# Patient Record
Sex: Female | Born: 1980 | Race: White | Hispanic: No | Marital: Single | Smoking: Current every day smoker
Health system: Southern US, Community
[De-identification: ages and names within clinical notes are randomized; demographics above are authoritative.]

## PROBLEM LIST (undated history)

## (undated) DIAGNOSIS — J189 Pneumonia, unspecified organism: Secondary | ICD-10-CM

## (undated) HISTORY — DX: Pneumonia, unspecified organism: J18.9

---

## 2004-11-16 ENCOUNTER — Other Ambulatory Visit: Admission: RE | Admit: 2004-11-16 | Discharge: 2004-11-16 | Payer: Self-pay | Admitting: Obstetrics and Gynecology

## 2004-11-17 ENCOUNTER — Ambulatory Visit (HOSPITAL_COMMUNITY): Admission: AD | Admit: 2004-11-17 | Discharge: 2004-11-17 | Payer: Self-pay | Admitting: Obstetrics and Gynecology

## 2005-07-01 ENCOUNTER — Inpatient Hospital Stay (HOSPITAL_COMMUNITY): Admission: AD | Admit: 2005-07-01 | Discharge: 2005-07-01 | Payer: Self-pay | Admitting: Obstetrics and Gynecology

## 2005-07-08 ENCOUNTER — Inpatient Hospital Stay (HOSPITAL_COMMUNITY): Admission: AD | Admit: 2005-07-08 | Discharge: 2005-07-11 | Payer: Self-pay | Admitting: Obstetrics and Gynecology

## 2006-04-02 ENCOUNTER — Ambulatory Visit: Payer: Self-pay | Admitting: Family Medicine

## 2006-04-26 ENCOUNTER — Ambulatory Visit: Payer: Self-pay | Admitting: Family Medicine

## 2006-05-01 ENCOUNTER — Other Ambulatory Visit: Admission: RE | Admit: 2006-05-01 | Discharge: 2006-05-01 | Payer: Self-pay | Admitting: Obstetrics and Gynecology

## 2006-10-29 ENCOUNTER — Telehealth: Payer: Self-pay | Admitting: *Deleted

## 2006-10-31 ENCOUNTER — Ambulatory Visit: Payer: Self-pay | Admitting: Sports Medicine

## 2006-10-31 DIAGNOSIS — K3189 Other diseases of stomach and duodenum: Secondary | ICD-10-CM

## 2006-10-31 DIAGNOSIS — R1013 Epigastric pain: Secondary | ICD-10-CM

## 2006-10-31 DIAGNOSIS — F331 Major depressive disorder, recurrent, moderate: Secondary | ICD-10-CM | POA: Insufficient documentation

## 2006-11-12 ENCOUNTER — Telehealth (INDEPENDENT_AMBULATORY_CARE_PROVIDER_SITE_OTHER): Payer: Self-pay | Admitting: *Deleted

## 2007-10-08 ENCOUNTER — Telehealth (INDEPENDENT_AMBULATORY_CARE_PROVIDER_SITE_OTHER): Payer: Self-pay | Admitting: Family Medicine

## 2007-10-24 ENCOUNTER — Ambulatory Visit: Payer: Self-pay | Admitting: Family Medicine

## 2007-10-24 DIAGNOSIS — L259 Unspecified contact dermatitis, unspecified cause: Secondary | ICD-10-CM

## 2007-12-22 ENCOUNTER — Ambulatory Visit: Payer: Self-pay | Admitting: Family Medicine

## 2007-12-22 DIAGNOSIS — R319 Hematuria, unspecified: Secondary | ICD-10-CM

## 2007-12-22 LAB — CONVERTED CEMR LAB
Bilirubin Urine: NEGATIVE
Protein, U semiquant: 100
Specific Gravity, Urine: >=1.03

## 2007-12-23 ENCOUNTER — Telehealth: Payer: Self-pay | Admitting: *Deleted

## 2009-04-09 ENCOUNTER — Encounter (INDEPENDENT_AMBULATORY_CARE_PROVIDER_SITE_OTHER): Payer: Self-pay | Admitting: *Deleted

## 2009-04-09 DIAGNOSIS — F17200 Nicotine dependence, unspecified, uncomplicated: Secondary | ICD-10-CM

## 2009-08-26 ENCOUNTER — Ambulatory Visit: Payer: Self-pay | Admitting: Family Medicine

## 2009-08-26 ENCOUNTER — Telehealth: Payer: Self-pay | Admitting: Family Medicine

## 2009-08-26 DIAGNOSIS — R05 Cough: Secondary | ICD-10-CM | POA: Insufficient documentation

## 2009-09-01 ENCOUNTER — Telehealth: Payer: Self-pay | Admitting: Family Medicine

## 2009-09-01 ENCOUNTER — Ambulatory Visit: Payer: Self-pay | Admitting: Family Medicine

## 2009-09-01 DIAGNOSIS — J189 Pneumonia, unspecified organism: Secondary | ICD-10-CM

## 2009-09-02 ENCOUNTER — Telehealth: Payer: Self-pay | Admitting: Family Medicine

## 2010-08-01 NOTE — Progress Notes (Signed)
Summary: pls call  Phone Note Call from Patient Call back at [phone]   Caller: Patient Summary of Call: pt needs to know whether she is contagious b/c her child wants to see her mother. Initial call taken by: De Nurse,  September 02, 2009 11:24 AM  Follow-up for Phone Call        called na  If she calls back tell her will not be very contagious after her fever is gone and she has two doses of antibiotics  Follow-up by: Pearlean Brownie MD,  September 02, 2009 1:49 PM

## 2010-08-01 NOTE — Progress Notes (Signed)
Summary: triage  Phone Note Call from Patient Call back at [phone]   Caller: Patient Summary of Call: started throwing up again yesterday and fever i s back - congested/coughing  Initial call taken by: De Nurse,  September 01, 2009 8:50 AM  Follow-up for Phone Call        states she is unable to keep anything down except for fruit. has tried chicken broth & oatmeal. fever continues. uses ibu & then it returns. has missed a lot of work & wants to be seen again. placed with Dr. Deirdre Priest as pcp not in schedule today. she is ok seeing another md Follow-up by: Golden Circle RN,  September 01, 2009 8:58 AM

## 2010-08-01 NOTE — Progress Notes (Signed)
   called patient - feeling better no fever this AM  Pearlean Brownie MD  September 02, 2009 10:53 AM

## 2010-08-01 NOTE — Progress Notes (Signed)
Summary: triage  Phone Note Call from Patient Call back at [phone]   Caller: Mom-Donna Summary of Call: sore throat/cough/fever(went away last night) - works in [employer] and can't get here until after 2:30 Initial call taken by: De Nurse,  August 26, 2009 9:42 AM  Follow-up for Phone Call        she will be here for 3pm work in Follow-up by: Golden Circle RN,  August 26, 2009 9:49 AM

## 2010-08-01 NOTE — Assessment & Plan Note (Signed)
Summary: cold symptoms/[place]/Jes Costales   Vital Signs:  Patient profile:   30 year old female Height:      66 inches Weight:      125 pounds BMI:     20.25 BSA:     1.64 Temp:     99.4 degrees F Pulse rate:   123 / minute BP sitting:   125 / 84  Vitals Entered By: Jone Baseman CMA (August 26, 2009 3:12 PM) CC: congestion x 3 days Is Patient Diabetic? No Pain Assessment Patient in pain? no        CC:  congestion x 3 days.  History of Present Illness: 1. congestion Has had fever and congestion for the last 3 days. Dtr sick with URI symptoms since saturday. Legs were sore initially but now better. Fever up to 102.5 -- mostly in the mornings. Used the humidier which seemed to help the cough some. Has taken dayquil with some relief.   ROS: some nausea and vomiting. No diarrhea. No joint or muscle aches now. No SOB. Some chest burnning with cough. Sore, raw throat with cough.   did not get the flu shot this year.   Habits & Providers  Alcohol-Tobacco-Diet     Tobacco Status: current     Tobacco Counseling: to quit use of tobacco products     Cigarette Packs/Day: 0.5  Current Medications (verified): 1)  Triamcinolone Acetonide 0.5 %  Crea (Triamcinolone Acetonide) .... Apply To Affected Area Daily During Flare.  Do Not Apply To Face or Genital.  15 Gram Tube  Allergies (verified): 1)  ! Keflex (Cephalexin)  Social History: Packs/Day:  0.5  Physical Exam  Additional Exam:  General:  Vital signs reviewed -- thin, tachy on vitals Alert, appropriate; well-dressed and well-nourished HEENT: EOMI, sclerae clear, OP pink and moist, no erythema or exudate; TMs with cerumen impaction bilaterally - clear after irrigation Neck: mild cervical LAD. No dominant node. Lungs:  work of breathing unlabored, clear to auscultation bilaterally; no wheezes, rales, or ronchi; good air movement throughout Heart:  regular rate and rhythm, no murmurs; normal s1/s2 (not tachy) Extremities:  no  cyanosis, clubbing, or edema Neurologic:  alert and oriented. speech normal.    Impression & Recommendations:  Problem # 1:  COUGH (ICD-786.2)  viral URI. cough medicine and other supportive measures.   Orders: [facility]- Est Level  3 (04540)  Complete Medication List: 1)  Triamcinolone Acetonide 0.5 % Crea (Triamcinolone acetonide) .... Apply to affected area daily during flare.  do not apply to face or genital.  15 gram tube 2)  Hydrocodone-homatropine 5-1.5 Mg/20ml Syrp (Hydrocodone-homatropine) .... 1/2 to 1 teaspoon every 12 hours as needed for cough 3)  Pseudoephedrine Hcl 60 Mg Tabs (Pseudoephedrine hcl) .... One by mouth every 6 hours as needed  Patient Instructions: 1)  This is a virus. It should run its course in about 7-10 days. 2)  Take ibuprofen 600 mg three to four times a day to help with pain, headache and fever. Take with food. 3)  Take pseudoephedrine to help with congestion and headache.  4)  take the cough medicine every 12 hours as needed.  5)  Some times gargling with saltwater can help your throat.  6)  Make sure you're drinking plenty of fluids. Your urine should run clear. 7)  Call if you feel worse suddenly or if not some better in the next 7 days.  Prescriptions: PSEUDOEPHEDRINE HCL 60 MG TABS (PSEUDOEPHEDRINE HCL) one by mouth every 6 hours as  needed  #30 x 0   Entered and Authorized by:   Myrtie Soman  MD   Signed by:   Myrtie Soman  MD on 08/26/2009   Method used:   Print then Give to Patient   RxID:   [id number] HYDROCODONE-HOMATROPINE 5-1.5 MG/5ML SYRP (HYDROCODONE-HOMATROPINE) 1/2 to 1 teaspoon every 12 hours as needed for cough  #60 cc x 0   Entered and Authorized by:   Myrtie Soman  MD   Signed by:   Myrtie Soman  MD on 08/26/2009   Method used:   Print then Give to Patient   RxID:   [id number] PSEUDOEPHEDRINE HCL 60 MG TABS (PSEUDOEPHEDRINE HCL) one by mouth every 6 hours as needed  #30 x 0   Entered and Authorized by:   Myrtie Soman  MD   Signed by:   Myrtie Soman  MD on 08/26/2009   Method used:   Print then Give to Patient   RxID:   [id number] HYDROCODONE-HOMATROPINE 5-1.5 MG/5ML SYRP (HYDROCODONE-HOMATROPINE) 1/2 to 1 teaspoon every 12 hours as needed for cough  #60 cc x 0   Entered and Authorized by:   Myrtie Soman  MD   Signed by:   Myrtie Soman  MD on 08/26/2009   Method used:   Print then Give to Patient   RxID:   [id number]

## 2010-08-01 NOTE — Assessment & Plan Note (Signed)
Summary: N&V, fever/see last OV/[place]   Vital Signs:  Patient profile:   30 year old female Height:      66 inches Weight:      120.9 pounds BMI:     19.58 Temp:     98.4 degrees F oral Pulse rate:   99 / minute BP sitting:   133 / 84  (right arm) Cuff size:   regular  Vitals Entered By: Garen Grams LPN (September 02, 4130 11:24 AM) CC: fever, cough, congestion, nausea, no apetite Is Patient Diabetic? No Pain Assessment Patient in pain? no        CC:  fever, cough, congestion, nausea, and no apetite.  History of Present Illness: URI Symptoms Onset: see Dr Rexene Alberts note on 2/8  since has worsened Description: achyal over productive cough  Modifying factors: tylenol and motrin help some Taking prescribed cough med and pseudophed  Symptoms Nasal discharge: yellow Fever: to 101 Sore throat: Mild Cough: constant Wheezing: no Ear pain: no GI symptoms: yes unable tokeep down solids drinking liquids well Sick contacts: dtr  Red Flags  Stiff neck: no Dyspnea: mild Rash: No Swallowing difficulty: No  Sinusitis Risk Factors Headache/face pain: yes Double sickening: yes tooth pain: no  Some pain in upper abdominal and right back.  No dysuria.  Was able to work yesterday   Habits & Providers  Alcohol-Tobacco-Diet     Tobacco Status: current     Cigarette Packs/Day: 0.5  Current Medications (verified): 1)  Triamcinolone Acetonide 0.5 %  Crea (Triamcinolone Acetonide) .... Apply To Affected Area Daily During Flare.  Do Not Apply To Face or Genital.  15 Gram Tube 2)  Hydrocodone-Homatropine 5-1.5 Mg/41ml Syrp (Hydrocodone-Homatropine) .... 1/2 To 1 Teaspoon Every 12 Hours As Needed For Cough 3)  Pseudoephedrine Hcl 60 Mg Tabs (Pseudoephedrine Hcl) .... One By Mouth Every 6 Hours As Needed 4)  Zithromax Z-Pak 250 Mg  Tabs (Azithromycin) .... Use As Directed  Allergies: 1)  ! Keflex (Cephalexin)  Physical Exam  General:  fatigued mildly ill no acute distress or  shortness of breath noted Eyes:  tender to percussion bilaterally  under eyes  Nose:  clear discharge  Mouth:  MM moist  Neck:  No deformities, masses, or tenderness noted.  supple Lungs:  crackles and dull in R base with rhonchi otherwise clear elsewhere Heart:  Normal rate and regular rhythm. S1 and S2 normal without gallop, murmur, click, rub or other extra sounds. Abdomen:  Bowel sounds positive,abdomen soft and non-tender without masses, organomegaly or hernias noted.   Impression & Recommendations:  Problem # 1:  PNEUMONIA, ORGANISM UNSPECIFIED (ICD-486)  post viral infectino with fever purulent sputum and focal lung findings.  Is stable so treat as otpt with antibiotics.  Encourage liquids and rest warned if worsening to return  Her updated medication list for this problem includes:    Zithromax Z-pak 250 Mg Tabs (Azithromycin) ..... Use as directed  Orders: [facility]- Est  Level 4 (44010)  Problem # 2:  TOBACCO USER (ICD-305.1) discussed she feels needs a substitute since is mainly a habit   Complete Medication List: 1)  Triamcinolone Acetonide 0.5 % Crea (Triamcinolone acetonide) .... Apply to affected area daily during flare.  do not apply to face or genital.  15 gram tube 2)  Hydrocodone-homatropine 5-1.5 Mg/66ml Syrp (Hydrocodone-homatropine) .... 1/2 to 1 teaspoon every 12 hours as needed for cough 3)  Pseudoephedrine Hcl 60 Mg Tabs (Pseudoephedrine hcl) .... One by mouth every 6 hours  as needed 4)  Zithromax Z-pak 250 Mg Tabs (Azithromycin) .... Use as directed  Patient Instructions: 1)  follow up with Dr Rexene Alberts as scheduled 2)  Doreatha Martin all your antibiotics  3)  If you develop high fever > 103 or difficulty breathing or this last for more than 7 days or if you are getting dehydrated 4)  Plenty of fluids 5)  Use tyelenol and motrin as needed Prescriptions: ZITHROMAX Z-PAK 250 MG  TABS (AZITHROMYCIN) Use as directed  #1 x 0   Entered and Authorized by:   Pearlean Brownie MD   Signed by:   Pearlean Brownie MD on 09/01/2009   Method used:   Handwritten   RxID:   [id number]

## 2010-08-01 NOTE — Letter (Signed)
Summary: Out of Work  [facility]  [address]   [place], [place] [address]   Phone: [phone]  Fax: [phone]    September 01, 2009   Employee:  [name]    To Whom It May Concern:   For Medical reasons, please excuse the above named employee from work for the following dates:  Start:   09/01/09  End:   09/03/09  If you need additional information, please feel free to contact our office.         Sincerely,    Pearlean Brownie MD

## 2010-08-05 ENCOUNTER — Encounter: Payer: Self-pay | Admitting: *Deleted

## 2010-11-17 NOTE — H&P (Signed)
NAME:  Bonnie Moody                 ACCOUNT NO.:  [id number]   MEDICAL RECORD NO.:  [id number]          PATIENT TYPE:  INP   LOCATION:                                FACILITY:  [facility]   PHYSICIAN:  Naima A. Dillard, M.D. DATE OF BIRTH:  04-25-1981   DATE OF ADMISSION:  07/08/2005  DATE OF DISCHARGE:  07/11/2005                                HISTORY & PHYSICAL   [name] is a 30 year old gravida 1, para 0, who presents at 39-2/7 weeks,  EDD July 13, 2005, with contractions increasing in frequency and  intensity throughout the day.  Her contractions are now every three minutes,  becoming stronger.  She reports positive fetal movement.  No vaginal  bleeding or rupture of membranes.  She denies any headache, visual changes  or epigastric pain.  Her pregnancy has been followed by the C.N.M. service  at [facility] and is remarkable for:   1.  Late to care.  2.  History of ovarian cyst.  3.  History of PID.  4.  Group B strep negative.   This patient began prenatal care at the office of [facility] at 17 weeks'  gestation.  EDC confirmed by ultrasound, confirmed with follow-up.  This  patient's pregnancy has been essentially unremarkable.  She has had some  preterm contractions.  Fetal fibronectin has been negative at 31 weeks and  at 33 weeks, and she has been normotensive throughout her pregnancy with no  proteinuria.   Prenatal lab work on Nov 16, 2004:  Hemoglobin and hematocrit 15.1 and 42.1,  platelets 344,000.  Blood type and Rh A+, antibody screen negative.  VDRL  nonreactive . Rubella immune.  hepatitis B surface antigen negative.  HIV  declined.  CF testing declined.  Pap smear within normal limits.  Quad  screen within normal limits.  At 28 weeks, one-hour glucose challenge within  normal limits.  At 36 weeks, culture of the vaginal tract is negative for  group B strep.   OBSTETRICAL HISTORY:  The current pregnancy.   The patient has allergies CEPHALEXIN.   She denies the use of  tobacco, alcohol or illicit drugs.   FAMILY HISTORY:  Maternal grandfather, hypertension.  Maternal grandmother,  varicose veins.  Maternal great-grandmother, colon cancer.   GENETIC HISTORY:  There is no genetic history of familial or chromosomal  disorders, children that were born with birth defects or any that died in  infancy.   SOCIAL HISTORY:  [name] a single 30 year old Caucasian female.  She is a  Consulting civil engineer.  Father of baby, Eartha Inch, works at the [employer].  He is involved and supportive.  They are [facility] in their faith.   REVIEW OF SYSTEMS:  As described above.  The patient is typical of one with  w uterine pregnancy at 39 weeks in early active labor.   PHYSICAL EXAMINATION:  VITAL SIGNS:  Stable. The patient is afebrile.  HEENT:  is unremarkable.  CARDIAC:  Regular rate and rhythm.  LUNGS:  Clear.  ABDOMEN:  Gravid in its contour.  The uterine fundus is  noted to extend 39  cm above the level of the pubic symphysis.  Leopold's maneuvers find the  infant to be in a longitudinal lie, cephalic presentation, and the estimated  fetal weight is 7-1/2 pounds.  The baseline of the fetal heart rate monitor  is 140s with average long-term variability.  Reactivity is present with no  periodic changes.  The patient is contracting every three to four minutes.  PELVIC:  Digital exam of the cervix finds it to be 4 cm dilated, 90%  effaced, with the cephalic presenting part at a -1 station and a bulging bag  of waters.  EXTREMITIES:  No pathologic edema.  DTRs are 1+ with no clonus.  No calf  tenderness is noted bilaterally.   ASSESSMENT:  Intrauterine pregnancy at term, early active labor.   PLAN:  Admit per Dr. Normand Sloop, routine C.N.M. orders.  The patient may have  epidural p.r.n.  Anticipate spontaneous vaginal delivery.      Rica Koyanagi, C.N.M.      Naima A. Normand Sloop, M.D.  Electronically Signed    SDM/MEDQ  D:  07/08/2005  T:  07/09/2005  Job:   [id number]

## 2010-11-29 ENCOUNTER — Inpatient Hospital Stay (INDEPENDENT_AMBULATORY_CARE_PROVIDER_SITE_OTHER)
Admission: RE | Admit: 2010-11-29 | Discharge: 2010-11-29 | Disposition: A | Discharge status: Home / Self Care | Payer: Managed Care, Other (non HMO) | Source: Ambulatory Visit | Attending: Family Medicine | Admitting: Family Medicine

## 2010-11-29 ENCOUNTER — Ambulatory Visit (INDEPENDENT_AMBULATORY_CARE_PROVIDER_SITE_OTHER): Payer: Managed Care, Other (non HMO)

## 2010-11-29 DIAGNOSIS — J189 Pneumonia, unspecified organism: Secondary | ICD-10-CM

## 2012-07-11 ENCOUNTER — Ambulatory Visit: Payer: Managed Care, Other (non HMO) | Admitting: Family Medicine

## 2013-02-20 ENCOUNTER — Ambulatory Visit: Payer: Self-pay | Admitting: Nurse Practitioner

## 2020-04-01 ENCOUNTER — Emergency Department (HOSPITAL_COMMUNITY)
Admission: EM | Admit: 2020-04-01 | Discharge: 2020-04-02 | Disposition: A | Discharge status: Home / Self Care | Payer: BC Managed Care – PPO | Attending: Emergency Medicine | Admitting: Emergency Medicine

## 2020-04-01 ENCOUNTER — Other Ambulatory Visit: Payer: Self-pay

## 2020-04-01 ENCOUNTER — Encounter (HOSPITAL_COMMUNITY): Payer: Self-pay | Admitting: Emergency Medicine

## 2020-04-01 DIAGNOSIS — J189 Pneumonia, unspecified organism: Secondary | ICD-10-CM

## 2020-04-01 DIAGNOSIS — F17210 Nicotine dependence, cigarettes, uncomplicated: Secondary | ICD-10-CM | POA: Insufficient documentation

## 2020-04-01 DIAGNOSIS — E876 Hypokalemia: Secondary | ICD-10-CM | POA: Insufficient documentation

## 2020-04-01 DIAGNOSIS — J188 Other pneumonia, unspecified organism: Secondary | ICD-10-CM | POA: Diagnosis not present

## 2020-04-01 DIAGNOSIS — J850 Gangrene and necrosis of lung: Secondary | ICD-10-CM | POA: Diagnosis not present

## 2020-04-01 DIAGNOSIS — Z20822 Contact with and (suspected) exposure to covid-19: Secondary | ICD-10-CM | POA: Insufficient documentation

## 2020-04-01 DIAGNOSIS — R059 Cough, unspecified: Secondary | ICD-10-CM | POA: Diagnosis present

## 2020-04-01 HISTORY — DX: Pneumonia, unspecified organism: J18.9

## 2020-04-01 LAB — CBC WITH DIFFERENTIAL/PLATELET
Abs Immature Granulocytes: 0.07 10*3/uL (ref 0.00–0.07)
Basophils Absolute: 0 10*3/uL (ref 0.0–0.1)
Basophils Relative: 0 %
Eosinophils Absolute: 0 10*3/uL (ref 0.0–0.5)
Eosinophils Relative: 0 %
HCT: 36.4 % (ref 36.0–46.0)
Hemoglobin: 12.6 g/dL (ref 12.0–15.0)
Immature Granulocytes: 1 %
Lymphocytes Relative: 6 %
Lymphs Abs: 0.9 10*3/uL (ref 0.7–4.0)
MCH: 33.1 pg (ref 26.0–34.0)
MCHC: 34.6 g/dL (ref 30.0–36.0)
MCV: 95.5 fL (ref 80.0–100.0)
Monocytes Absolute: 1 10*3/uL (ref 0.1–1.0)
Monocytes Relative: 7 %
Neutro Abs: 12.9 10*3/uL — ABNORMAL HIGH (ref 1.7–7.7)
Neutrophils Relative %: 86 %
Platelets: 616 10*3/uL — ABNORMAL HIGH (ref 150–400)
RBC: 3.81 MIL/uL — ABNORMAL LOW (ref 3.87–5.11)
RDW: 13.5 % (ref 11.5–15.5)
WBC: 14.9 10*3/uL — ABNORMAL HIGH (ref 4.0–10.5)
nRBC: 0 % (ref 0.0–0.2)

## 2020-04-01 LAB — I-STAT BETA HCG BLOOD, ED (MC, WL, AP ONLY): I-stat hCG, quantitative: 15.6 m[IU]/mL — ABNORMAL HIGH (ref <5)

## 2020-04-01 NOTE — ED Triage Notes (Addendum)
Pt advised by UC to come to ED for chest CT, pt c/o cough, night sweats x 3 weeks, emesis today, decreased appetite, sore throat.  Pt has copy of rad report of chest xray done today, ?cavitation, ?fluid.  Neg covid test, pt has weekly covid test as an employee of [employer]. Pt did start taking Zpak

## 2020-04-02 ENCOUNTER — Telehealth: Payer: Self-pay | Admitting: Pulmonary Disease

## 2020-04-02 ENCOUNTER — Emergency Department (HOSPITAL_COMMUNITY): Payer: BC Managed Care – PPO

## 2020-04-02 ENCOUNTER — Encounter (HOSPITAL_COMMUNITY): Payer: Self-pay | Admitting: Emergency Medicine

## 2020-04-02 DIAGNOSIS — J850 Gangrene and necrosis of lung: Secondary | ICD-10-CM

## 2020-04-02 LAB — URINALYSIS, ROUTINE W REFLEX MICROSCOPIC
Bilirubin Urine: NEGATIVE
Glucose, UA: NEGATIVE mg/dL
Ketones, ur: 5 mg/dL — AB
Nitrite: NEGATIVE
Protein, ur: 100 mg/dL — AB
Specific Gravity, Urine: 1.02 (ref 1.005–1.030)
pH: 5 (ref 5.0–8.0)

## 2020-04-02 LAB — COMPREHENSIVE METABOLIC PANEL
ALT: 12 U/L (ref 0–44)
AST: 11 U/L — ABNORMAL LOW (ref 15–41)
Albumin: 2.5 g/dL — ABNORMAL LOW (ref 3.5–5.0)
Alkaline Phosphatase: 87 U/L (ref 38–126)
Anion gap: 14 (ref 5–15)
BUN: <5 mg/dL — ABNORMAL LOW (ref 6–20)
CO2: 25 mmol/L (ref 22–32)
Calcium: 8.7 mg/dL — ABNORMAL LOW (ref 8.9–10.3)
Chloride: 96 mmol/L — ABNORMAL LOW (ref 98–111)
Creatinine, Ser: 0.65 mg/dL (ref 0.44–1.00)
GFR calc Af Amer: >60 mL/min (ref >60)
GFR calc non Af Amer: >60 mL/min (ref >60)
Glucose, Bld: 128 mg/dL — ABNORMAL HIGH (ref 70–99)
Potassium: 2.8 mmol/L — ABNORMAL LOW (ref 3.5–5.1)
Sodium: 135 mmol/L (ref 135–145)
Total Bilirubin: 0.9 mg/dL (ref 0.3–1.2)
Total Protein: 6.8 g/dL (ref 6.5–8.1)

## 2020-04-02 LAB — PROTIME-INR
INR: 1 (ref 0.8–1.2)
Prothrombin Time: 12.7 seconds (ref 11.4–15.2)

## 2020-04-02 LAB — RAPID URINE DRUG SCREEN, HOSP PERFORMED
Amphetamines: NOT DETECTED
Barbiturates: POSITIVE — AB
Benzodiazepines: NOT DETECTED
Cocaine: NOT DETECTED
Opiates: NOT DETECTED
Tetrahydrocannabinol: POSITIVE — AB

## 2020-04-02 LAB — RESPIRATORY PANEL BY RT PCR (FLU A&B, COVID)
Influenza A by PCR: NEGATIVE
Influenza B by PCR: NEGATIVE
SARS Coronavirus 2 by RT PCR: NEGATIVE

## 2020-04-02 LAB — HIV ANTIBODY (ROUTINE TESTING W REFLEX): HIV Screen 4th Generation wRfx: NONREACTIVE

## 2020-04-02 LAB — EXPECTORATED SPUTUM ASSESSMENT W GRAM STAIN, RFLX TO RESP C

## 2020-04-02 LAB — STREP PNEUMONIAE URINARY ANTIGEN: Strep Pneumo Urinary Antigen: NEGATIVE

## 2020-04-02 LAB — LACTIC ACID, PLASMA: Lactic Acid, Venous: 1.4 mmol/L (ref 0.5–1.9)

## 2020-04-02 LAB — PREGNANCY, URINE: Preg Test, Ur: NEGATIVE

## 2020-04-02 MED ORDER — IOHEXOL 300 MG/ML  SOLN
75.0000 mL | Freq: Once | INTRAMUSCULAR | Status: AC | PRN
  Administered 2020-04-02: 75 mL via INTRAVENOUS

## 2020-04-02 MED ORDER — ONDANSETRON HCL 4 MG PO TABS: 4.0000 mg | ORAL_TABLET | Freq: Four times a day (QID) | ORAL | 0 refills | Status: DC

## 2020-04-02 MED ORDER — SODIUM CHLORIDE 0.9 % IV BOLUS
1000.0000 mL | Freq: Once | INTRAVENOUS | Status: AC
  Administered 2020-04-02: 1000 mL via INTRAVENOUS

## 2020-04-02 MED ORDER — ACETAMINOPHEN 325 MG PO TABS
650.0000 mg | ORAL_TABLET | Freq: Once | ORAL | Status: AC
  Administered 2020-04-02: 650 mg via ORAL
  Filled 2020-04-02: qty 2

## 2020-04-02 MED ORDER — AMOXICILLIN-POT CLAVULANATE 875-125 MG PO TABS: 1.0000 | ORAL_TABLET | Freq: Two times a day (BID) | ORAL | 0 refills | Status: DC

## 2020-04-02 MED ORDER — POTASSIUM CHLORIDE CRYS ER 20 MEQ PO TBCR
40.0000 meq | EXTENDED_RELEASE_TABLET | Freq: Once | ORAL | Status: AC
  Administered 2020-04-02: 40 meq via ORAL
  Filled 2020-04-02: qty 2

## 2020-04-02 MED ORDER — POTASSIUM CHLORIDE ER 10 MEQ PO TBCR: 10.0000 meq | EXTENDED_RELEASE_TABLET | Freq: Every day | ORAL | 0 refills | Status: DC

## 2020-04-02 MED ORDER — FLUCONAZOLE 150 MG PO TABS: ORAL_TABLET | ORAL | 0 refills | Status: DC

## 2020-04-02 MED ORDER — ONDANSETRON HCL 4 MG/2ML IJ SOLN
4.0000 mg | Freq: Once | INTRAMUSCULAR | Status: AC
  Administered 2020-04-02: 4 mg via INTRAVENOUS
  Filled 2020-04-02: qty 2

## 2020-04-02 MED ORDER — AMOXICILLIN-POT CLAVULANATE 875-125 MG PO TABS: 1.0000 | ORAL_TABLET | Freq: Two times a day (BID) | ORAL | 0 refills | Status: AC

## 2020-04-02 MED ORDER — LEVETIRACETAM ER 500 MG PO TB24
1000.0000 mg | ORAL_TABLET | Freq: Every day | ORAL | Status: DC
  Administered 2020-04-02: 1000 mg via ORAL
  Filled 2020-04-02: qty 2

## 2020-04-02 NOTE — Discharge Instructions (Addendum)
Start taking augmentin 875mg  twice daily for 1 month. You will follow at [facility] clinic on [address] in 4 weeks. They will call you with the appointment date and time.  I have also given you a prescription for potassium given the your potassium was low today.  Please take as directed.  You should have your labs rechecked by your regular doctor in 1 week for reassessment of this.  I gave a prescription for Zofran to help with potential nausea from taking the antibiotics and I also gave you a prescription for fluconazole in case you develop any symptoms of a yeast infection.  Do not take this medication unless you actually develop symptoms of a yeast infection.

## 2020-04-02 NOTE — Consult Note (Signed)
NAME:  Bonnie Moody, MRN:  [id number], DOB:  1981-05-07, LOS: 0 ADMISSION DATE:  04/01/2020, CONSULTATION DATE:  04/02/20 REFERRING MD:  Leonia Corona, PA CHIEF COMPLAINT:  Cough  Brief History   Bonnie Moody is a 39 year old woman, daily smoker who presented with a cough of 3 weeks with chills, fever and night sweats.    History of present illness   39 year old woman, daily smoke with epilepsy and history of pneumonias presents with 3 weeks of productive cough associated with fevers, chills, nigh sweats, nausea and anorexia.   She went to urgent care for her symptoms yesterday and was provided a z-pack. She was later called by the urgent care team regarding her chest radiograph results and was instructed to go to the emergency room for concern of right upper lobe cavitary lesion. In the ED she had CT chest scan performed which shows a 7.5 x 6.6 x 6.1cm consolidation with central cavitation and air fluid level.   She reports having 3 pneumonias over the past 15 years. She has poor dentition and reports being instructed to have her teeth removed but due to the expense of having this done she has delayed this care. 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 Most recent negative PPD was last year. She denies any extensive travel to TB endemic areas. She has history of epilepsy which is controlled on antiepileptic medications but she does report only having breakthrough seizures in her sleep. She does drink alcohol but denies binge drinking. She smokes 1 pack of cigarettes daily for the past 15 years and uses marijuana occasionally. Denies other drug use.   Past Medical History  Pneumonia Seizures Depression  Significant Hospital Events     Consults:  PCCM  Procedures:    Significant Diagnostic Tests:  CT Chest 04/02/20 IMPRESSION: 1. Large aggressive cavitary process predominantly within the right upper lobe and extending across the major fissure.  This measures at least 7.5 cm. There are additional tree-in-bud opacities in the right middle and right lower lobe. Findings most likely represent multifocal infection including tuberculosis, actinomycosis, mucormycosis and blastomycosis. Underlying neoplasm would be difficult to exclude. Pulmonary consult recommended. A follow-up CT is recommended post treatment. 2. Mildly enlarged mediastinal and right hilar lymph nodes, likely reactive. 3. Mild paraseptal emphysema.  Micro Data:  Blood culture 10/1>>  Antimicrobials:    Interim history/subjective:    Objective   Blood pressure 129/76, pulse 92, temperature 98.3 F (36.8 C), temperature source Oral, resp. rate 19, SpO2 97 %.       No intake or output data in the 24 hours ending 04/02/20 1449 There were no vitals filed for this visit.  Examination: General: No acute distress HENT: atraumatic, normocephalic, moist mucous membranes, poor dentition  Lungs: left clear to auscultation. Course breath sounds and rales on right lung fields Cardiovascular: RRR, s1s2, no murmur Abdomen: soft, non-tender, non-distended, bowels sounds present Extremities: no edema. Warm/dry Neuro: A&Ox3, no focal deficits  Resolved Hospital Problem list     Assessment & Plan:  Necrotizing Pneumonia In setting of poor dentition and aspiration risk with seizure disorder - Start augmentin 875mg  twice daily for 4 weeks (allergy to cephalexin reported but she has tolerated Augmentin previously) - We will schedule follow up in the pulmonary clinic with repeat chest radiograph in 4 weeks. At that time we will determine if further antibiotics are warranted.  - If she does not improve on  antibiotic therapy then we will consider bronchoscopy - She will require follow up CT chest in 3 months - Check HIV and quantiferon gold - Check urine drug screen - Obtain sputum culture   Tobacco Use - Briefly discussed the importance of smoking cessation - Will  follow up in clinic regarding smoking cessation therapies  Dental Disease - Recommended the patient have her dental disease assessed in order to prevent further pneumonias in the future as well as further complications within her oral cavity   Labs   CBC: Recent Labs  Lab 04/01/20 2339  WBC 14.9*  NEUTROABS 12.9*  HGB 12.6  HCT 36.4  MCV 95.5  PLT 616*    Basic Metabolic Panel: Recent Labs  Lab 04/01/20 2339  NA 135  K 2.8*  CL 96*  CO2 25  GLUCOSE 128*  BUN <5*  CREATININE 0.65  CALCIUM 8.7*   GFR: CrCl cannot be calculated (Unknown ideal weight.). Recent Labs  Lab 04/01/20 2339  WBC 14.9*  LATICACIDVEN 1.4    Liver Function Tests: Recent Labs  Lab 04/01/20 2339  AST 11*  ALT 12  ALKPHOS 87  BILITOT 0.9  PROT 6.8  ALBUMIN 2.5*   No results for input(s): LIPASE, AMYLASE in the last 168 hours. No results for input(s): AMMONIA in the last 168 hours.  ABG No results found for: PHART, PCO2ART, PO2ART, HCO3, TCO2, ACIDBASEDEF, O2SAT   Coagulation Profile: Recent Labs  Lab 04/01/20 2339  INR 1.0    Cardiac Enzymes: No results for input(s): CKTOTAL, CKMB, CKMBINDEX, TROPONINI in the last 168 hours.  HbA1C: No results found for: HGBA1C  CBG: No results for input(s): GLUCAP in the last 168 hours.  Review of Systems:   Review of Systems  Constitutional: Positive for chills and fever.  HENT: Negative for congestion, nosebleeds, sinus pain and sore throat.   Eyes: Negative for blurred vision.  Respiratory: Positive for cough, sputum production and shortness of breath. Negative for hemoptysis and wheezing.   Cardiovascular: Negative for chest pain, palpitations, orthopnea, claudication, leg swelling and PND.  Gastrointestinal: Positive for nausea and vomiting. Negative for blood in stool and diarrhea.  Genitourinary: Negative for dysuria and hematuria.  Musculoskeletal: Negative for joint pain and myalgias.  Skin: Negative for itching and rash.    Neurological: Negative for dizziness, weakness and headaches.  Endo/Heme/Allergies: Does not bruise/bleed easily.  Psychiatric/Behavioral: Negative.    Past Medical History  She,  has a past medical history of Pneumonia.   Surgical History   History reviewed. No pertinent surgical history.   Social History   reports that she has been smoking cigarettes. She has never used smokeless tobacco. She reports previous alcohol use. She reports previous drug use.   Family History   Her family history is not on file.   Allergies Allergies  Allergen Reactions  . Cephalexin     REACTION: unknown reaction as a baby     Home Medications  Prior to Admission medications   Medication Sig Start Date End Date Taking? Authorizing Provider  azithromycin (ZITHROMAX) 250 MG tablet Take 250 mg by mouth as directed. 04/01/20   [provider]  HYDROcodone-homatropine (HYCODAN) 5-1.5 MG/5ML syrup Take 1/2 to 1 teaspoon every 12 hours as needed for cough     [provider]  pseudoephedrine (SUDAFED) 60 MG tablet Take 60 mg by mouth every 6 (six) hours as needed.      [provider]  triamcinolone (KENALOG) 0.5 % cream Apply topically daily. to  affected area during flare.  Do not apply to face or genital     [provider]

## 2020-04-02 NOTE — Telephone Encounter (Signed)
Please schedule patient for hospital follow up on 11/1 or 11/2 during my clinic days and let her know the date/time of the appointment. She will need 2 view chest radiograph prior to the visit.   Thanks, Cletis Athens

## 2020-04-02 NOTE — ED Provider Notes (Signed)
[facility] EMERGENCY DEPARTMENT Provider Note   CSN: [id number] Arrival date & time: 04/01/20  2227     History Chief Complaint  Patient presents with  . Cough    Bonnie Moody is a 39 y.o. female.  HPI   Pt is a 39 y/o female who presents to the ED today for eval of a cough with an abnormal chest x-ray. Cough has been ongoing for the last few weeks. She has had a sputum production with yellow/green/brown sputum. Denies any hemoptysis. Denies BLE swelling, birth control use, recent surgery/hospital admission, h/o VTE/CA/IVDU.  She reports nausea, vomiting x1 week ago. Denies abd pain, diarrhea, constipation or urinary symptoms.  Denies chest pain or shortness of breath.   States she was seen at urgent care yesterday and was diagnosed with pneumonia and was given rx for zpak. Was later called back and sent to the ED after official radiology read showed  "suspect aggressive pneumonia RUL with cavitary air fluid level. Possible pulmonic abscess. Correlation with CT chest with IV contrast is recommended." (per paperwork mom has on her phone at bedside from the clinic)  She was tested at urgent care for COVID and it was negative. States she has never been in prison. 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 No recent foreign travel.   Past Medical History:  Diagnosis Date  . Pneumonia     Patient Active Problem List   Diagnosis Date Noted  . PNEUMONIA, ORGANISM UNSPECIFIED 09/01/2009  . COUGH 08/26/2009  . TOBACCO USER 04/09/2009  . HEMATURIA UNSPECIFIED 12/22/2007  . ECZEMA, HANDS 10/24/2007  . 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 DYSPEPSIA 10/31/2006    History reviewed. No pertinent surgical history.   OB History   No obstetric history on file.     No family history on file.  Social History   Tobacco Use  . Smoking status: Current Every Day Smoker    Types: Cigarettes  . Smokeless tobacco:  Never Used  Substance Use Topics  . Alcohol use: Not Currently  . Drug use: Not Currently    Home Medications Prior to Admission medications   Medication Sig Start Date End Date Taking? Authorizing Provider  amoxicillin-clavulanate (AUGMENTIN) 875-125 MG tablet Take 1 tablet by mouth every 12 (twelve) hours. 04/02/20 05/02/20  Macauley Mossberg S, PA-C  azithromycin (ZITHROMAX) 250 MG tablet Take 250 mg by mouth as directed. 04/01/20   [provider]  fluconazole (DIFLUCAN) 150 MG tablet Take one tablet on the day you fill the prescription. If you continue to have symptoms then take the second tablet in 3 days. 04/02/20   Kyrillos Adams S, PA-C  HYDROcodone-homatropine (HYCODAN) 5-1.5 MG/5ML syrup Take 1/2 to 1 teaspoon every 12 hours as needed for cough     [provider]  ondansetron (ZOFRAN) 4 MG tablet Take 1 tablet (4 mg total) by mouth every 6 (six) hours. 04/02/20   Albena Comes S, PA-C  potassium chloride (KLOR-CON) 10 MEQ tablet Take 1 tablet (10 mEq total) by mouth daily for 5 days. 04/02/20 04/07/20  Haniya Fern S, PA-C  pseudoephedrine (SUDAFED) 60 MG tablet Take 60 mg by mouth every 6 (six) hours as needed.      [provider]  triamcinolone (KENALOG) 0.5 % cream Apply topically daily. to affected area during flare.  Do not apply to face or genital     [provider]    Allergies  Cephalexin  Review of Systems   Review of Systems  Constitutional: Positive for chills, fatigue and fever.  HENT: Negative for ear pain and sore throat.   Eyes: Negative for visual disturbance.  Respiratory: Positive for cough. Negative for shortness of breath.   Cardiovascular: Negative for chest pain and leg swelling.  Gastrointestinal: Positive for nausea and vomiting. Negative for abdominal pain, constipation and diarrhea.  Genitourinary: Negative for dysuria and hematuria.  Musculoskeletal: Negative for back pain.  Skin: Negative for rash.    Neurological: Negative for syncope.  All other systems reviewed and are negative.   Physical Exam Updated Vital Signs BP 131/85   Pulse 84   Temp 98.3 F (36.8 C) (Oral)   Resp 18   SpO2 98%   Physical Exam Vitals and nursing note reviewed.  Constitutional:      General: She is not in acute distress.    Appearance: She is well-developed.  HENT:     Head: Normocephalic and atraumatic.     Mouth/Throat:     Mouth: Mucous membranes are dry.  Eyes:     Conjunctiva/sclera: Conjunctivae normal.  Cardiovascular:     Rate and Rhythm: Regular rhythm. Tachycardia present.     Pulses: Normal pulses.     Heart sounds: Normal heart sounds. No murmur heard.   Pulmonary:     Effort: Pulmonary effort is normal. No respiratory distress.     Breath sounds: Normal breath sounds. No wheezing, rhonchi or rales.  Abdominal:     General: Bowel sounds are normal.     Palpations: Abdomen is soft.     Tenderness: There is no abdominal tenderness. There is no guarding or rebound.  Musculoskeletal:     Cervical back: Neck supple.  Skin:    General: Skin is warm and dry.  Neurological:     Mental Status: She is alert.     ED Results / Procedures / Treatments   Labs (all labs ordered are listed, but only abnormal results are displayed) Labs Reviewed  COMPREHENSIVE METABOLIC PANEL - Abnormal; Notable for the following components:      Result Value   Potassium 2.8 (*)    Chloride 96 (*)    Glucose, Bld 128 (*)    BUN <5 (*)    Calcium 8.7 (*)    Albumin 2.5 (*)    AST 11 (*)    All other components within normal limits  CBC WITH DIFFERENTIAL/PLATELET - Abnormal; Notable for the following components:   WBC 14.9 (*)    RBC 3.81 (*)    Platelets 616 (*)    Neutro Abs 12.9 (*)    All other components within normal limits  URINALYSIS, ROUTINE W REFLEX MICROSCOPIC - Abnormal; Notable for the following components:   Color, Urine Sophiarose (*)    APPearance CLOUDY (*)    Hgb urine dipstick  SMALL (*)    Ketones, ur 5 (*)    Protein, ur 100 (*)    Leukocytes,Ua MODERATE (*)    Bacteria, UA RARE (*)    All other components within normal limits  I-STAT BETA HCG BLOOD, ED ([facility], [facility], [facility] ONLY) - Abnormal; Notable for the following components:   I-stat hCG, quantitative 15.6 (*)    All other components within normal limits  RESPIRATORY PANEL BY RT PCR (FLU A&B, COVID)  CULTURE, BLOOD (ROUTINE X 2)  CULTURE, BLOOD (ROUTINE X 2)  EXPECTORATED SPUTUM ASSESSMENT W REFEX TO RESP CULTURE  LACTIC ACID, PLASMA  PROTIME-INR  PREGNANCY, URINE  STREP PNEUMONIAE URINARY ANTIGEN  LEGIONELLA PNEUMOPHILA SEROGP 1 UR AG  QUANTIFERON-TB GOLD PLUS  HIV ANTIBODY (ROUTINE TESTING W REFLEX)  RAPID URINE DRUG SCREEN, HOSP PERFORMED    EKG None  Radiology CT Chest W Contrast  Result Date: 04/02/2020 CLINICAL DATA:  CT chest with contrast due to pneumonia Pt advised by UC to come to ED for chest CT, pt c/o cough, night sweats x 3 weeks, emesis today, decreased appetite, sore throat. Pt has copy of rad report of chest xray done today, ?cavi.*comment was truncated*^28mL OMNIPAQUE IOHEXOL 300 MG/ML SOLN EXAM: CT CHEST WITH CONTRAST TECHNIQUE: Multidetector CT imaging of the chest was performed during intravenous contrast administration. CONTRAST:  40mL OMNIPAQUE IOHEXOL 300 MG/ML  SOLN COMPARISON:  None. FINDINGS: Cardiovascular: No significant vascular findings. Normal heart size. No pericardial effusion. Mediastinum/Nodes: There are mildly enlarged mediastinal lymph nodes for example a precarinal lymph node measuring 1.2 cm in short axis (series 3, image 60). There are prominent right hilar lymph nodes. No axillary adenopathy. Thyroid gland, trachea, and esophagus demonstrate no significant findings. Lungs/Pleura: Central airways are patent. There is a large area of consolidation with central cavitation involving the posterior right upper lobe with extension across the major fissure into the right lower  lobe. Overall measurements are somewhat difficult given irregular borders but measures approximately 7.5 x 6.6 x 6.1 cm. There are additional tree-in-bud opacities in the involving the right middle and right lower lobe, predominantly medial. The left lung appears clear. No pneumothorax or pleural effusion. There is mild paraseptal emphysema. Upper Abdomen: No acute abnormality. Musculoskeletal: No chest wall abnormality. No acute or significant osseous findings. IMPRESSION: 1. Large aggressive cavitary process predominantly within the right upper lobe and extending across the major fissure. This measures at least 7.5 cm. There are additional tree-in-bud opacities in the right middle and right lower lobe. Findings most likely represent multifocal infection including tuberculosis, actinomycosis, mucormycosis and blastomycosis. Underlying neoplasm would be difficult to exclude. Pulmonary consult recommended. A follow-up CT is recommended post treatment. 2. Mildly enlarged mediastinal and right hilar lymph nodes, likely reactive. 3. Mild paraseptal emphysema. Emphysema (ICD10-J43.9). These results were called by telephone at the time of interpretation on 04/02/2020 at 11:56 am to provider [facility] , who verbally acknowledged these results. Electronically Signed   By: Emmaline Kluver M.D.   On: 04/02/2020 11:57    Procedures Procedures (including critical care time)  Medications Ordered in ED Medications  levETIRAcetam (KEPPRA XR) 24 hr tablet 1,000 mg (1,000 mg Oral Given 04/02/20 1214)  sodium chloride 0.9 % bolus 1,000 mL (0 mLs Intravenous Stopped 04/02/20 1215)  potassium chloride SA (KLOR-CON) CR tablet 40 mEq (40 mEq Oral Given 04/02/20 1043)  ondansetron (ZOFRAN) injection 4 mg (4 mg Intravenous Given 04/02/20 1043)  acetaminophen (TYLENOL) tablet 650 mg (650 mg Oral Given 04/02/20 1216)  iohexol (OMNIPAQUE) 300 MG/ML solution 75 mL (75 mLs Intravenous Contrast Given 04/02/20 1110)    ED Course    I have reviewed the triage vital signs and the nursing notes.  Pertinent labs & imaging results that were available during my care of the patient were reviewed by me and considered in my medical decision making (see chart for details).    MDM Rules/Calculators/A&P                          39 year old female presenting for evaluation of cough, fevers, nausea vomiting.  Reviewed/interpreted labs CBC shows a  leukocytosis at 14,000, thrombocytosis as well CMP with hypokalemia, potassium of 2.8, albumin low, LFTs and kidney function are normal Lactic acid is negative UA with moderate leuks, 0-5 RBCs, 11-20 WBCs, rare bacteria and 25-50 squamous epithelial cells suggesting contaminated specimen. Initial beta-hCG slightly positive, likely false positive Urine preg negative COVID/flu negative Blood cultures obtained  CT chest reviewed/interpreted - 1. Large aggressive cavitary process predominantly within the right upper lobe and extending across the major fissure. This measures at least 7.5 cm. There are additional tree-in-bud opacities in the right middle and right lower lobe. Findings most likely represent multifocal infection including tuberculosis, actinomycosis, mucormycosis and blastomycosis. Underlying neoplasm would be difficult to exclude. Pulmonary consult recommended. A follow-up CT is recommended post treatment. 2. Mildly enlarged mediastinal and right hilar lymph nodes, likely reactive. 3. Mild paraseptal emphysema.  12:21 PM CONSULT with APP with pulmonology who discussed case with her attending physician. Their team will come evaluate the patient.   Dr. Francine Graven with pulmonology evaluated the patient at bedside.  Suspects that this infection is likely secondary to aspiration and recommended giving Augmentin x1 month.  They have ordered several additional labs and have recommended that the patient follow-up in the office in 1 month for reassessment.  They do not feel that she requires  admission at this time.  Reassessed patient.  Discussed plan for discharge on Augmentin.  Advised on close follow-up with pulmonology and strict return precautions.  She voices understanding of the plan and reasons to return.  All questions answered.  Patient stable for discharge.   Final Clinical Impression(s) / ED Diagnoses Final diagnoses:  Cavitary pneumonia  Hypokalemia    Rx / DC Orders ED Discharge Orders         Ordered    amoxicillin-clavulanate (AUGMENTIN) 875-125 MG tablet  Every 12 hours,   Status:  Discontinued        04/02/20 1509    potassium chloride (KLOR-CON) 10 MEQ tablet  Daily,   Status:  Discontinued        04/02/20 1509    ondansetron (ZOFRAN) 4 MG tablet  Every 6 hours,   Status:  Discontinued        04/02/20 1509    amoxicillin-clavulanate (AUGMENTIN) 875-125 MG tablet  Every 12 hours        04/02/20 1612    ondansetron (ZOFRAN) 4 MG tablet  Every 6 hours        04/02/20 1612    potassium chloride (KLOR-CON) 10 MEQ tablet  Daily        04/02/20 1612    fluconazole (DIFLUCAN) 150 MG tablet        04/02/20 1614           Keslie Gritz S, PA-C 04/02/20 1616    Virgina Norfolk, DO 04/02/20 2108

## 2020-04-03 LAB — LEGIONELLA PNEUMOPHILA SEROGP 1 UR AG: L. pneumophila Serogp 1 Ur Ag: NEGATIVE

## 2020-04-04 LAB — CULTURE, RESPIRATORY W GRAM STAIN: Culture: NORMAL

## 2020-04-04 LAB — QUANTIFERON-TB GOLD PLUS (RQFGPL)
QuantiFERON Mitogen Value: 0.3 IU/mL
QuantiFERON Nil Value: 0.03 IU/mL
QuantiFERON TB1 Ag Value: 0.02 IU/mL
QuantiFERON TB2 Ag Value: 0.02 IU/mL

## 2020-04-04 LAB — QUANTIFERON-TB GOLD PLUS: QuantiFERON-TB Gold Plus: UNDETERMINED — AB

## 2020-04-04 NOTE — Telephone Encounter (Signed)
Attempted to call pt but line went straight to VM. Left message for pt to return call.  Pt needs a HFU appt scheduled with Dr. Francine Graven either 11/1 or 11/2. When pt returns call, please get this appt scheduled for her.

## 2020-04-05 ENCOUNTER — Telehealth: Payer: Self-pay | Admitting: *Deleted

## 2020-04-05 NOTE — Telephone Encounter (Signed)
Post ED Visit - Positive Culture Follow-up  Culture report reviewed by antimicrobial stewardship pharmacist: [facility] []  , Pharm.D. []  Enzo Bi, Pharm.D., BCPS AQ-ID []  , Pharm.D., BCPS []  Celedonio Miyamoto, Pharm.D., BCPS []  [place], Garvin Fila.D., BCPS, AAHIVP []  , Pharm.D., BCPS, AAHIVP []  Georgina Pillion, PharmD, BCPS []  , PharmD, BCPS []  [place], PharmD, BCPS []  [address], PharmD []  , PharmD, BCPS []  Estella Husk, PharmD  Pharmacy Team []  Lysle Pearl, PharmD []  , PharmD []  Phillips Climes, PharmD []  , Rph []  Agapito Games) , PharmD []  Verlan Friends, PharmD []  , PharmD []  Mervyn Gay, PharmD []  , PharmD []  Vinnie Level, PharmD []  [facility], PharmD []  , PharmD []  Len Childs, PharmD   Positive respiratory culture Treated with Amoxicillin-Pot Clavulanate, organism sensitive to the same and no further patient follow-up is required at this time. , MD  Greer Pickerel Talley 04/05/2020, 9:47 AM

## 2020-04-07 LAB — CULTURE, BLOOD (ROUTINE X 2)
Culture: NO GROWTH
Culture: NO GROWTH
Special Requests: ADEQUATE
Special Requests: ADEQUATE

## 2020-05-02 ENCOUNTER — Encounter: Payer: Self-pay | Admitting: Emergency Medicine

## 2020-05-02 ENCOUNTER — Encounter: Payer: Self-pay | Admitting: *Deleted

## 2020-05-02 ENCOUNTER — Ambulatory Visit: Payer: BC Managed Care – PPO | Admitting: Emergency Medicine

## 2020-05-02 ENCOUNTER — Other Ambulatory Visit: Payer: Self-pay

## 2020-05-02 DIAGNOSIS — J852 Abscess of lung without pneumonia: Secondary | ICD-10-CM | POA: Insufficient documentation

## 2020-05-02 DIAGNOSIS — J851 Abscess of lung with pneumonia: Secondary | ICD-10-CM | POA: Diagnosis not present

## 2020-05-02 NOTE — Progress Notes (Signed)
Subjective:    Patient ID: Bonnie Moody, female    DOB: 24-Oct-1980, 39 y.o.   MRN: [id number]  HPI 39 year old smoker (15 pack years), with a history of seizures, poor dentition.  She was in the emergency department 04/02/2020 with several days of fever, chills, night sweats, nausea and emesis.  Subsequently developed cough productive of sputum.  She underwent a CT scan of her chest on 10/2 during that evaluation which I have reviewed, shows a right upper lobe cavitary infiltrate that extends across the major fissure, 7.5 cm in largest diameter.  There were tree-in-bud inflammatory changes also seen in the right middle and right lower lobes, some mildly enlarged right hilar mediastinal nodes.  Respiratory culture from 10/2 shows normal flora.   She reports that her cough has resolved. Her nausea and PO intake have improved. She has completed a month of Augmentin. She is on Keppra, seizures well-controlled currently although she did have a seizure in her sleep just prior to her PNA. She is planning to see Neuro in [place] today. She is due to follow with dentistry.    Review of Systems As per HPI  Past Medical History:  Diagnosis Date  . Pneumonia       No family history on file.   Social History   Socioeconomic History  . Marital status: Single    Spouse name: Not on file  . Number of children: Not on file  . Years of education: Not on file  . Highest education level: Not on file  Occupational History  . Not on file  Tobacco Use  . Smoking status: Current Every Day Smoker    Packs/day: 0.50    Years: 15.00    Pack years: 7.50    Types: Cigarettes  . Smokeless tobacco: Never Used  Vaping Use  . Vaping Use: Never used  Substance and Sexual Activity  . Alcohol use: Not Currently  . Drug use: Not Currently  . Sexual activity: Not on file  Other Topics Concern  . Not on file  Social History Narrative  . Not on file   Social Determinants of Health   Financial Resource  Strain:   . Difficulty of Paying Living Expenses: Not on file  Food Insecurity:   . Worried About Programme researcher, broadcasting/film/video in the Last Year: Not on file  . Ran Out of Food in the Last Year: Not on file  Transportation Needs:   . Lack of Transportation (Medical): Not on file  . Lack of Transportation (Non-Medical): Not on file  Physical Activity:   . Days of Exercise per Week: Not on file  . Minutes of Exercise per Session: Not on file  Stress:   . Feeling of Stress : Not on file  Social Connections:   . Frequency of Communication with Friends and Family: Not on file  . Frequency of Social Gatherings with Friends and Family: Not on file  . Attends Religious Services: Not on file  . Active Member of Clubs or Organizations: Not on file  . Attends Banker Meetings: Not on file  . Marital Status: Not on file  Intimate Partner Violence:   . Fear of Current or Ex-Partner: Not on file  . Emotionally Abused: Not on file  . Physically Abused: Not on file  . 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   Allergies  Allergen Reactions  . Cephalexin     REACTION: unknown reaction as  a baby     Outpatient Medications Prior to Visit  Medication Sig Dispense Refill  . amoxicillin-clavulanate (AUGMENTIN) 875-125 MG tablet Take 1 tablet by mouth every 12 (twelve) hours. 60 tablet 0  . levETIRAcetam (KEPPRA XR) 500 MG 24 hr tablet Take 1,000 mg by mouth daily.    Marland Kitchen azithromycin (ZITHROMAX) 250 MG tablet Take 250 mg by mouth as directed.    . fluconazole (DIFLUCAN) 150 MG tablet Take one tablet on the day you fill the prescription. If you continue to have symptoms then take the second tablet in 3 days. 2 tablet 0  . HYDROcodone-homatropine (HYCODAN) 5-1.5 MG/5ML syrup Take 1/2 to 1 teaspoon every 12 hours as needed for cough     . ondansetron (ZOFRAN) 4 MG tablet Take 1 tablet (4 mg total) by mouth every 6 (six) hours. 12 tablet 0  . potassium chloride (KLOR-CON) 10  MEQ tablet Take 1 tablet (10 mEq total) by mouth daily for 5 days. 5 tablet 0  . pseudoephedrine (SUDAFED) 60 MG tablet Take 60 mg by mouth every 6 (six) hours as needed.      . triamcinolone (KENALOG) 0.5 % cream Apply topically daily. to affected area during flare.  Do not apply to face or genital      No facility-administered medications prior to visit.        Objective:   Physical Exam Vitals:   05/02/20 1042  BP: 118/70  Pulse: 85  Temp: (!) 97.5 F (36.4 C)  TempSrc: Temporal  SpO2: 98%  Weight: 138 lb 6.4 oz (62.8 kg)  Height: 5\' 6"  (1.676 m)   Gen: Pleasant, well-nourished, in no distress,  normal affect  ENT: No lesions,  Poor dentition especially uppers, significant carries and broken teeth.   Neck: No JVD, no stridor  Lungs: No use of accessory muscles, no crackles or wheezing on normal respiration, no wheeze on forced expiration  Cardiovascular: RRR, heart sounds normal, no murmur or gallops, no peripheral edema  Musculoskeletal: No deformities, no cyanosis or clubbing  Neuro: alert, awake, non focal  Skin: Warm, no lesions or rash      Assessment & Plan:  Lung abscess (HCC) Possibly due to aspiration event, seizure.  Certainly impacted by her poor dentition.  She has been on Augmentin for 1 month with a clinical improvement.  She needs repeat CT chest to assess for interval improvement in her lung abscess.  She also needs urgent dental evaluation.  It may be that she can take advantage of her medical insurance to address this given its significant impact on her overall health, pneumonia, etc.  There has been a delay in her getting dental surgery because her dental coverage leaves her with significant financial responsibility.  Okay to stop Augmentin for now We will plan to repeat your CT scan of the chest with contrast to follow for resolution/improvement of your right lung pneumonia, abscess.  Depending on change we will decide whether you need to be back on  antibiotics or need any other evaluation, testing. Continue your Keppra, agree with neurology follow-up as planned Agree with urgent dental evaluation and possible dental surgery.  I recommend investigating as to whether your standard medical insurance could be used to help address your dental needs.  Certainly any delay in getting your dental care will potentially impact your risk for recurrent pneumonias. Follow Dr. next available after your CT so that we can review the results together. Call if you have any changes in cough,  fevers or other new symptoms.   Levy Pupa, MD, PhD 05/02/2020, 11:07 AM [facility] [phone] or if no answer [phone]

## 2020-05-02 NOTE — Assessment & Plan Note (Signed)
Possibly due to aspiration event, seizure.  Certainly impacted by her poor dentition.  She has been on Augmentin for 1 month with a clinical improvement.  She needs repeat CT chest to assess for interval improvement in her lung abscess.  She also needs urgent dental evaluation.  It may be that she can take advantage of her medical insurance to address this given its significant impact on her overall health, pneumonia, etc.  There has been a delay in her getting dental surgery because her dental coverage leaves her with significant financial responsibility.  Okay to stop Augmentin for now We will plan to repeat your CT scan of the chest with contrast to follow for resolution/improvement of your right lung pneumonia, abscess.  Depending on change we will decide whether you need to be back on antibiotics or need any other evaluation, testing. Continue your Keppra, agree with neurology follow-up as planned Agree with urgent dental evaluation and possible dental surgery.  I recommend investigating as to whether your standard medical insurance could be used to help address your dental needs.  Certainly any delay in getting your dental care will potentially impact your risk for recurrent pneumonias. Follow Dr. Delton Coombes next available after your CT so that we can review the results together. Call if you have any changes in cough, fevers or other new symptoms.

## 2020-05-02 NOTE — Patient Instructions (Signed)
Okay to stop Augmentin for now We will plan to repeat your CT scan of the chest with contrast to follow for resolution/improvement of your right lung pneumonia, abscess.  Depending on change we will decide whether you need to be back on antibiotics or need any other evaluation, testing. Continue your Keppra, agree with neurology follow-up as planned Agree with urgent dental evaluation and possible dental surgery.  I recommend investigating as to whether your standard medical insurance could be used to help address your dental needs.  Certainly any delay in getting your dental care will potentially impact your risk for recurrent pneumonias. Follow Dr. Delton Coombes next available after your CT so that we can review the results together. Call if you have any changes in cough, fevers or other new symptoms.

## 2021-06-08 IMAGING — CT CT CHEST W/ CM
2 of 4 series · 15 of 36 positions shown, 18 images · IV contrast (APPLIED)
Comparison: None.

CLINICAL DATA: CT chest with contrast due to pneumonia Pt advised
by [facility] to come to ED for chest CT, pt c/o cough, night sweats x 3
weeks, emesis today, decreased appetite, sore throat. Pt has copy of
rad report of chest xray done today, ?cavi.*comment was
truncated*^75mL OMNIPAQUE IOHEXOL 300 MG/ML SOLN

EXAM:
CT CHEST WITH CONTRAST
TECHNIQUE: Multidetector CT imaging of the chest was performed during
intravenous contrast administration.
CONTRAST:  75mL OMNIPAQUE IOHEXOL 300 MG/ML  SOLN

[Series 3: chest w · axial · 0.81mm/px · z∈[+969,+1281]mm · 12 of 184 slices shown, 15 images]
[im 14/184  mediastinal]
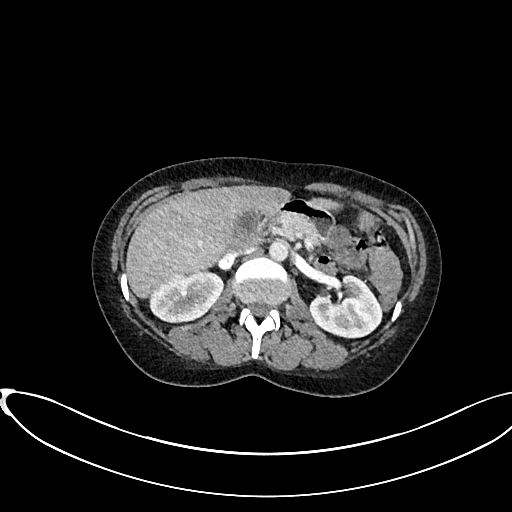
[im 14/184  lung]
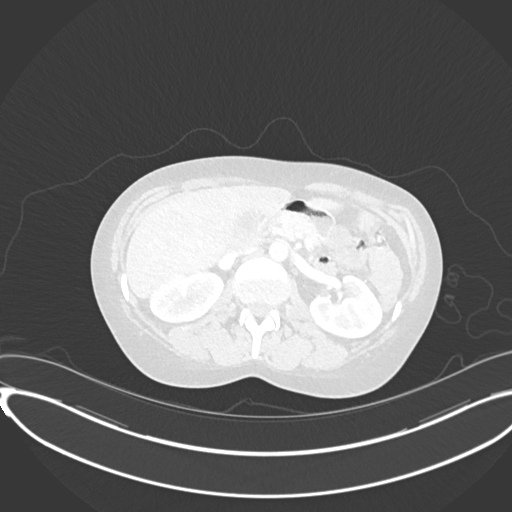
[im 27/184  lung]
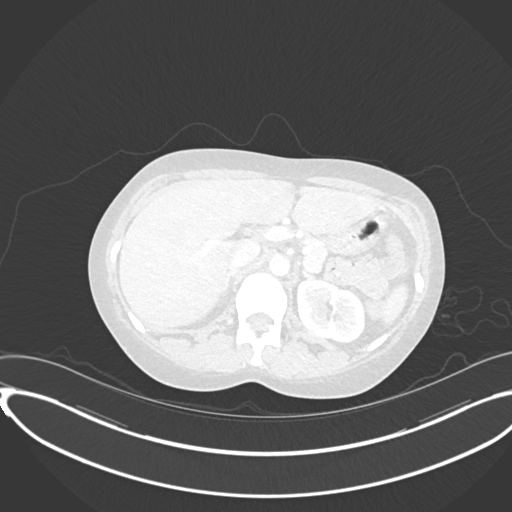
[im 40/184  lung]
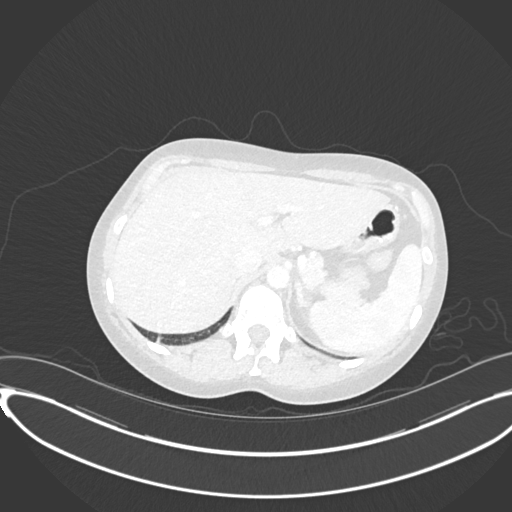
[im 53/184  lung]
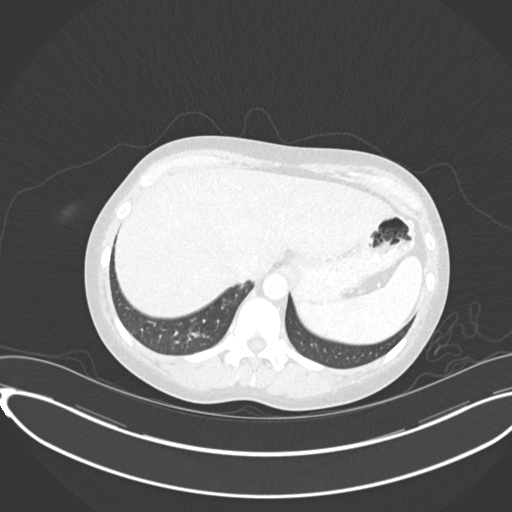
[im 66/184  mediastinal]
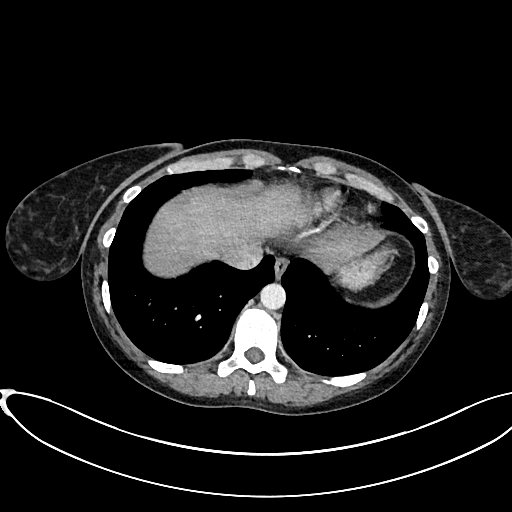
[im 66/184  lung]
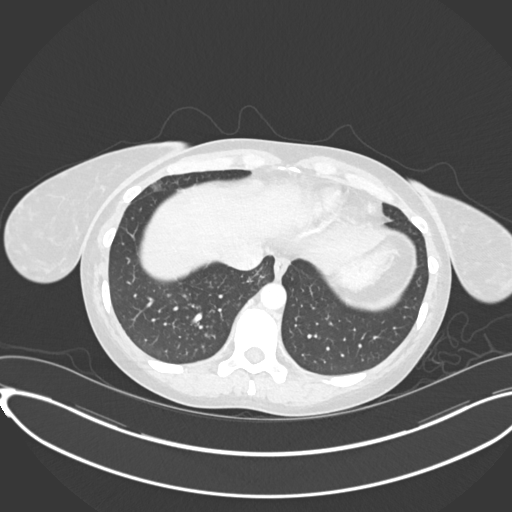
[im 79/184  lung]
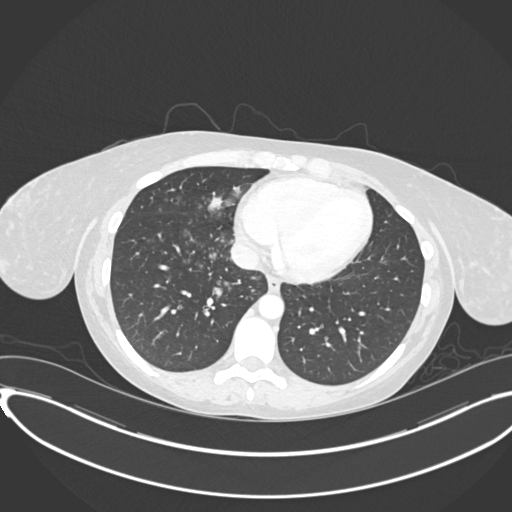
[im 105/184  lung]
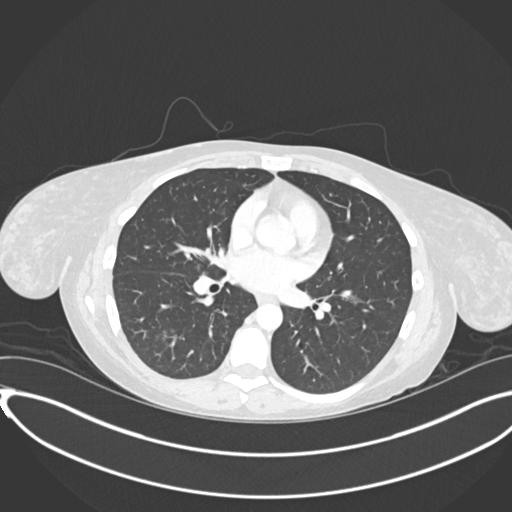
[im 118/184  lung]
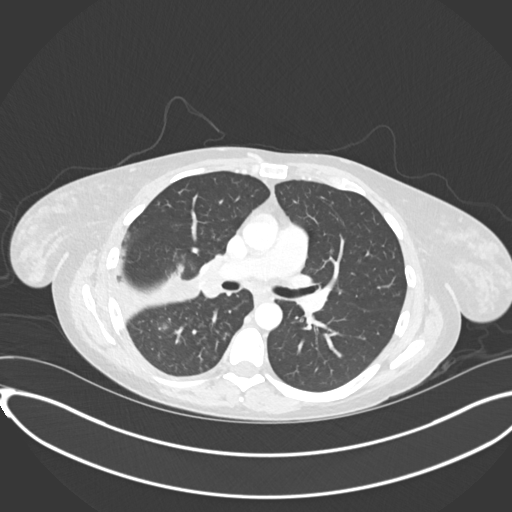
[im 131/184  mediastinal]
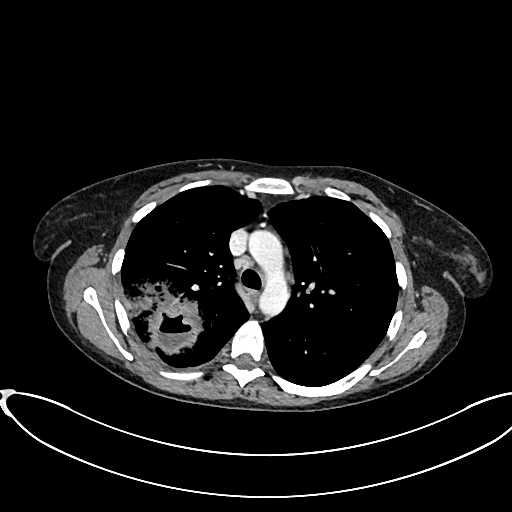
[im 131/184  lung]
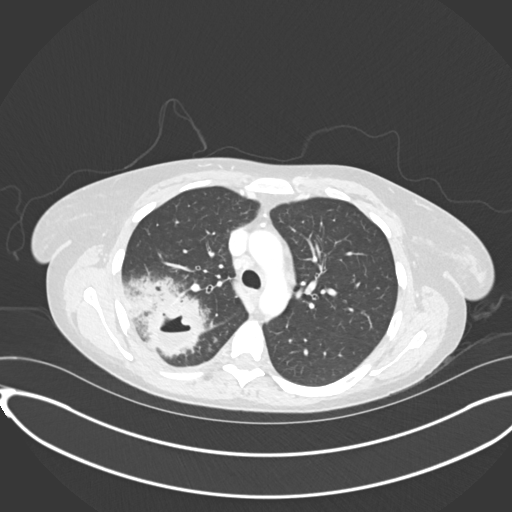
[im 144/184  lung]
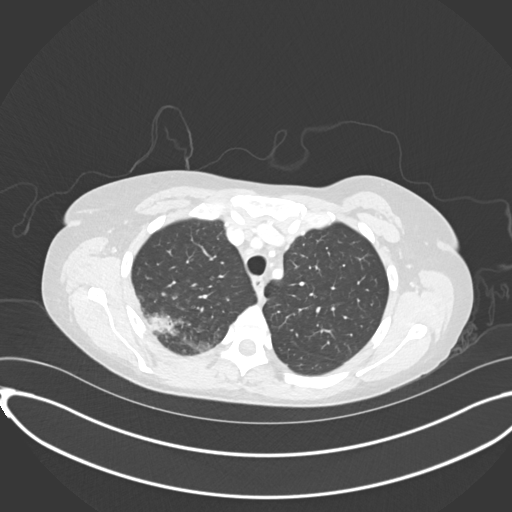
[im 157/184  lung]
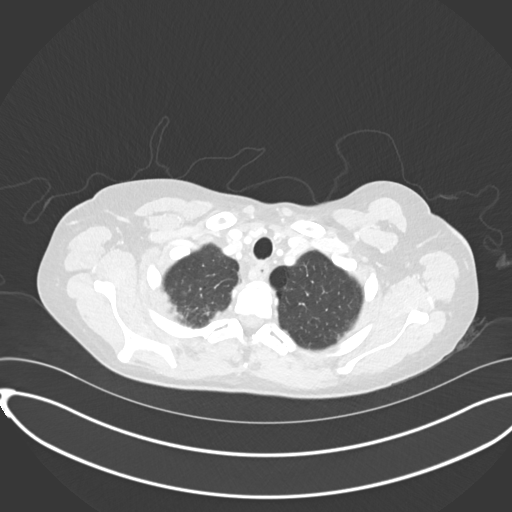
[im 170/184  lung]
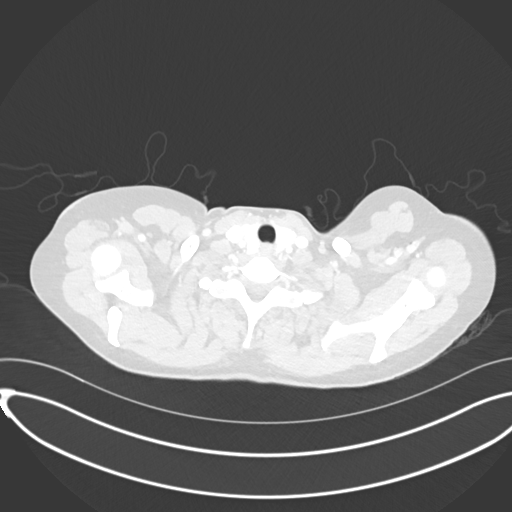

[Series 6: cor · coronal · 0.73mm/px · 3 of 133 slices shown]
[im 27/133  lung]
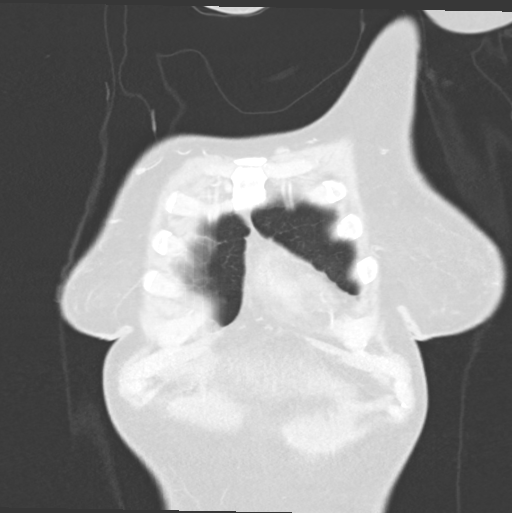
[im 53/133  lung]
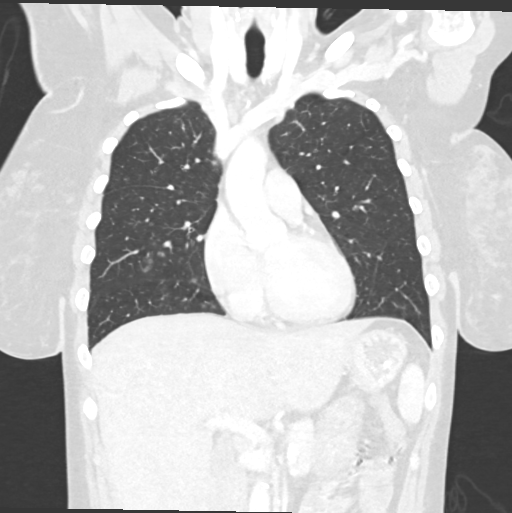
[im 80/133  lung]
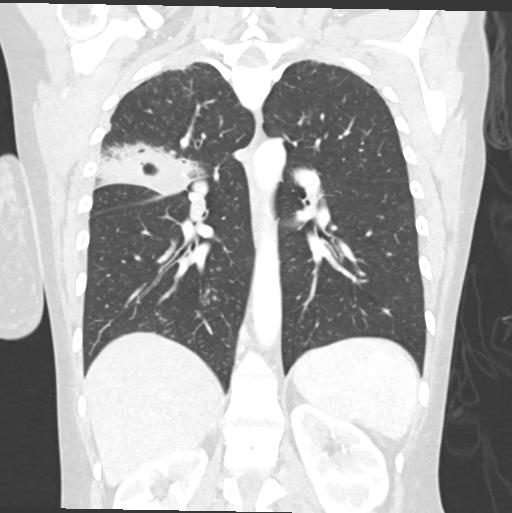

[15 of 36 positions shown; findings below may reference images not displayed]

FINDINGS: Cardiovascular: No significant vascular findings. Normal heart size.
No pericardial effusion.

Mediastinum/Nodes: There are mildly enlarged mediastinal lymph nodes
for example a precarinal lymph node measuring 1.2 cm in short axis
(series 3, image 60). There are prominent right hilar lymph nodes.
No axillary adenopathy. Thyroid gland, trachea, and esophagus
demonstrate no significant findings.

Lungs/Pleura: Central airways are patent. There is a large area of
consolidation with central cavitation involving the posterior right
upper lobe with extension across the major fissure into the right
lower lobe. Overall measurements are somewhat difficult given
irregular borders but measures approximately 7.5 x 6.6 x 6.1 cm.
There are additional tree-in-bud opacities in the involving the
right middle and right lower lobe, predominantly medial. The left
lung appears clear. No pneumothorax or pleural effusion. There is
mild paraseptal emphysema.

Upper Abdomen: No acute abnormality.

Musculoskeletal: No chest wall abnormality. No acute or significant
osseous findings.
IMPRESSION: 1. Large aggressive cavitary process predominantly within the right
upper lobe and extending across the major fissure. This measures at
least 7.5 cm. There are additional tree-in-bud opacities in the
right middle and right lower lobe. Findings most likely represent
multifocal infection including tuberculosis, actinomycosis,
mucormycosis and blastomycosis. Underlying neoplasm would be
difficult to exclude. Pulmonary consult recommended. A follow-up CT
is recommended post treatment.
2. Mildly enlarged mediastinal and right hilar lymph nodes, likely
reactive.
3. Mild paraseptal emphysema.

Emphysema ([id number]).

These results were called by telephone at the time of interpretation
on 04/02/2020 at [DATE] to provider CALLUM TIGER , who verbally
acknowledged these results.
